# Patient Record
Sex: Female | Born: 2000 | Race: White | Hispanic: No | Marital: Single
Health system: Southern US, Community
[De-identification: ages and names within clinical notes are randomized; demographics above are authoritative.]

---
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
## 2020-03-02 ENCOUNTER — Ambulatory Visit: Payer: Self-pay | Attending: Internal Medicine

## 2020-03-02 DIAGNOSIS — Z713 Dietary counseling and surveillance: Secondary | ICD-10-CM | POA: Diagnosis not present

## 2020-03-02 DIAGNOSIS — Z23 Encounter for immunization: Secondary | ICD-10-CM

## 2020-03-02 DIAGNOSIS — Z0000 Encounter for general adult medical examination without abnormal findings: Secondary | ICD-10-CM | POA: Diagnosis not present

## 2020-03-02 DIAGNOSIS — Z7182 Exercise counseling: Secondary | ICD-10-CM | POA: Diagnosis not present

## 2020-03-02 DIAGNOSIS — Z68.41 Body mass index (BMI) pediatric, 85th percentile to less than 95th percentile for age: Secondary | ICD-10-CM | POA: Diagnosis not present

## 2020-03-02 NOTE — Progress Notes (Signed)
   Covid-19 Vaccination Clinic  Name:  Katelyn Vasquez    MRN: [id number] DOB: [birth year]  03/02/2020  [name] was observed post Covid-19 immunization for 15 minutes without incident. She was provided with Vaccine Information Sheet and instruction to access the V-Safe system.   [name] was instructed to call 911 with any severe reactions post vaccine: Marland Kitchen Difficulty breathing  . Swelling of face and throat  . A fast heartbeat  . A bad rash all over body  . Dizziness and weakness   Immunizations Administered    Name Date Dose VIS Date Route   Pfizer COVID-19 Vaccine 03/02/2020  4:27 PM 0.3 mL 09/24/2018 Intramuscular   Manufacturer: [employer], [employer]   Lot: [id number]   NDC: [address]-6

## 2020-05-09 DIAGNOSIS — Z1152 Encounter for screening for COVID-19: Secondary | ICD-10-CM | POA: Diagnosis not present

## 2020-05-09 DIAGNOSIS — J029 Acute pharyngitis, unspecified: Secondary | ICD-10-CM | POA: Diagnosis not present

## 2020-07-23 DIAGNOSIS — Z20828 Contact with and (suspected) exposure to other viral communicable diseases: Secondary | ICD-10-CM | POA: Diagnosis not present

## 2020-07-23 DIAGNOSIS — R0981 Nasal congestion: Secondary | ICD-10-CM | POA: Diagnosis not present

## 2020-07-29 DIAGNOSIS — U071 COVID-19: Secondary | ICD-10-CM | POA: Diagnosis not present

## 2020-12-25 DIAGNOSIS — Z7189 Other specified counseling: Secondary | ICD-10-CM | POA: Diagnosis not present

## 2020-12-25 DIAGNOSIS — Z68.41 Body mass index (BMI) pediatric, 85th percentile to less than 95th percentile for age: Secondary | ICD-10-CM | POA: Diagnosis not present

## 2020-12-25 DIAGNOSIS — Z0000 Encounter for general adult medical examination without abnormal findings: Secondary | ICD-10-CM | POA: Diagnosis not present

## 2020-12-25 DIAGNOSIS — Z713 Dietary counseling and surveillance: Secondary | ICD-10-CM | POA: Diagnosis not present

## 2021-03-09 ENCOUNTER — Other Ambulatory Visit (HOSPITAL_COMMUNITY): Payer: Self-pay

## 2021-03-09 MED ORDER — KETOROLAC TROMETHAMINE 10 MG PO TABS
10.0000 mg | ORAL_TABLET | Freq: Four times a day (QID) | ORAL | 0 refills | Status: DC
  Filled 2021-03-09: qty 16, 4d supply, fill #0

## 2021-03-09 MED ORDER — HYDROCODONE-ACETAMINOPHEN 7.5-325 MG PO TABS
0.5000 | ORAL_TABLET | ORAL | 0 refills | Status: DC | PRN
  Filled 2021-03-09: qty 6, 1d supply, fill #0

## 2021-03-14 DIAGNOSIS — K011 Impacted teeth: Secondary | ICD-10-CM | POA: Diagnosis not present

## 2021-07-06 DIAGNOSIS — F419 Anxiety disorder, unspecified: Secondary | ICD-10-CM | POA: Diagnosis not present

## 2021-07-13 DIAGNOSIS — F419 Anxiety disorder, unspecified: Secondary | ICD-10-CM | POA: Diagnosis not present

## 2021-07-15 ENCOUNTER — Other Ambulatory Visit (HOSPITAL_COMMUNITY): Payer: Self-pay

## 2021-07-15 ENCOUNTER — Ambulatory Visit: Payer: BC Managed Care – PPO | Admitting: Sports Medicine

## 2021-07-15 ENCOUNTER — Encounter: Payer: Self-pay | Admitting: Sports Medicine

## 2021-07-15 DIAGNOSIS — B070 Plantar wart: Secondary | ICD-10-CM | POA: Diagnosis not present

## 2021-07-15 DIAGNOSIS — M79671 Pain in right foot: Secondary | ICD-10-CM

## 2021-07-15 DIAGNOSIS — R61 Generalized hyperhidrosis: Secondary | ICD-10-CM | POA: Diagnosis not present

## 2021-07-15 MED ORDER — DRYSOL 20 % EX SOLN
Freq: Every day | CUTANEOUS | 5 refills | Status: AC
  Filled 2021-07-15 – 2021-07-28 (×2): qty 60, 30d supply, fill #0

## 2021-07-15 MED ORDER — CIMETIDINE 200 MG PO TABS
200.0000 mg | ORAL_TABLET | Freq: Every day | ORAL | 1 refills | Status: AC
  Filled 2021-07-15: qty 30, 30d supply, fill #0

## 2021-07-15 NOTE — Progress Notes (Signed)
Subjective: Katelyn Vasquez is a 20 y.o. female patient who presents to office for evaluation of Right foot pain secondary to moderately painful warts. States that for the last few months a few more has popped up, tried to cut them out herself.  Patient denies any other pedal complaints.   Review of Systems  All other systems reviewed and are negative.   Patient Active Problem List   Diagnosis Date Noted   Tension-type headache, not intractable 02/02/2016   Migraine with aura and without status migrainosus, not intractable 03/04/2014     No current outpatient medications on file prior to visit.   No current facility-administered medications on file prior to visit.    No Known Allergies  Objective:  General: Alert and oriented x3 in no acute distress  Dermatology: Keratotic lesion present measuring <0.2 cm at plantar ball, medial arch and heel on  right foot there are 3 lesions with no skin lines transversing the lesion, pain is present with medial lateral pressure to the lesion, capillaries with pin point bleeding noted, no webspace macerations, no ecchymosis bilateral, all nails x 10 are well manicured.  Vascular: Dorsalis Pedis and Posterior Tibial pedal pulses 2/4, Capillary Fill Time 3 seconds, + pedal hair growth bilateral, no edema bilateral lower extremities, Temperature gradient mildly increased, hyperhidrosis bilateral.  Neurology: Gross sensation intact via light touch bilateral.  Musculoskeletal: Mild tenderness with palpation at the lesion sites on Right, Muscular strength 5/5 in all groups without pain or limitation on range of motion. No symptomatic lower extremity muscular or boney deformity noted.  Assessment and Plan: Problem List Items Addressed This Visit   None Visit Diagnoses     Plantar wart of right foot    -  Primary   Right foot pain       Hyperhidrosis          -Complete examination performed -Discussed treatment options for warts -Parred keratoic  warty lesions x 3 using a chisel blade; treated the area with Catharidin covered with bandaid and coban; Advised patient of blistering reaction that will occur from application of medication and once this happens replace bandaid with neosporin and tape/bandaid -Rx Drysol for hyperhydrosis -Rx Tagamet to help with wart immunity  -Patient to return to office in 4 weeks for wart recheck or sooner if condition worsens.  Asencion Islam, DPM

## 2021-07-26 ENCOUNTER — Other Ambulatory Visit (HOSPITAL_COMMUNITY): Payer: Self-pay

## 2021-07-28 ENCOUNTER — Other Ambulatory Visit (HOSPITAL_COMMUNITY): Payer: Self-pay

## 2021-08-12 ENCOUNTER — Encounter: Payer: Self-pay | Admitting: Sports Medicine

## 2021-08-12 ENCOUNTER — Other Ambulatory Visit: Payer: Self-pay

## 2021-08-12 ENCOUNTER — Ambulatory Visit: Payer: BC Managed Care – PPO | Admitting: Sports Medicine

## 2021-08-12 DIAGNOSIS — B070 Plantar wart: Secondary | ICD-10-CM

## 2021-08-12 DIAGNOSIS — M79671 Pain in right foot: Secondary | ICD-10-CM

## 2021-08-12 DIAGNOSIS — R61 Generalized hyperhidrosis: Secondary | ICD-10-CM

## 2021-08-13 ENCOUNTER — Encounter: Payer: Self-pay | Admitting: Sports Medicine

## 2021-08-13 NOTE — Progress Notes (Signed)
Subjective: Katelyn Vasquez is a 21 y.o. female patient who presents to office for follow up evaluation of right foot wart, reports that it looks better. Think that its healed up.    Patient Active Problem List   Diagnosis Date Noted   Tension-type headache, not intractable 02/02/2016   Migraine with aura and without status migrainosus, not intractable 03/04/2014     Current Outpatient Medications on File Prior to Visit  Medication Sig Dispense Refill   aluminum chloride (DRYSOL) 20 % external solution Apply topically at bedtime. 60 mL 5   cimetidine (TAGAMET HB) 200 MG tablet Take 1 tablet (200 mg total) by mouth at bedtime. 30 tablet 1   No current facility-administered medications on file prior to visit.    No Known Allergies  Objective:  General: Alert and oriented x3 in no acute distress  Dermatology: Minimal Keratotic lesion present measuring <0.1 cm at plantar ball, medial arch and heel on  right foot there are 3 lesions with no skin lines transversing the lesion, pain is present with medial lateral pressure to the lesion, capillaries with pin point bleeding noted, no webspace macerations, no ecchymosis bilateral, all nails x 10 are well manicured.  Vascular: Dorsalis Pedis and Posterior Tibial pedal pulses 2/4, Capillary Fill Time 3 seconds, + pedal hair growth bilateral, no edema bilateral lower extremities, Temperature gradient mildly increased, hyperhidrosis bilateral.  Neurology: Gross sensation intact via light touch bilateral.  Musculoskeletal: Mild tenderness with palpation at the lesion sites on Right, Muscular strength 5/5 in all groups without pain or limitation on range of motion. No symptomatic lower extremity muscular or boney deformity noted.  Assessment and Plan: Problem List Items Addressed This Visit   None Visit Diagnoses     Plantar wart of right foot    -  Primary   Right foot pain       Hyperhidrosis          -Complete examination  performed -Discussed treatment options for warts -Parred keratoic warty lesions x 3 using a chisel blade; treated the area with Catharidin covered with bandaid, This is treatment #2 to area. Advised patient of blistering reaction that will occur from application of medication and once this happens replace bandaid with neosporin and tape/bandaid -Continue with Drysol for hyperhydrosis -Tagamet completed to help with wart immunity  -Patient to return to office PRN or sooner if condition worsens.  Asencion Islam, DPM

## 2021-09-05 ENCOUNTER — Other Ambulatory Visit (HOSPITAL_COMMUNITY): Payer: Self-pay

## 2021-09-05 DIAGNOSIS — L709 Acne, unspecified: Secondary | ICD-10-CM | POA: Diagnosis not present

## 2021-09-05 DIAGNOSIS — Z1339 Encounter for screening examination for other mental health and behavioral disorders: Secondary | ICD-10-CM | POA: Diagnosis not present

## 2021-09-06 ENCOUNTER — Other Ambulatory Visit (HOSPITAL_COMMUNITY): Payer: Self-pay

## 2021-09-06 MED ORDER — CLINDAMYCIN PHOSPHATE 1 % EX LOTN
1.0000 "application " | TOPICAL_LOTION | Freq: Two times a day (BID) | CUTANEOUS | 6 refills | Status: AC | PRN
  Filled 2021-09-06: qty 60, 30d supply, fill #0

## 2021-09-14 ENCOUNTER — Other Ambulatory Visit (HOSPITAL_COMMUNITY): Payer: Self-pay

## 2022-01-06 ENCOUNTER — Other Ambulatory Visit (HOSPITAL_COMMUNITY): Payer: Self-pay

## 2022-01-06 MED ORDER — AMOXICILLIN-POT CLAVULANATE 875-125 MG PO TABS
1.0000 | ORAL_TABLET | Freq: Two times a day (BID) | ORAL | 0 refills | Status: AC
  Filled 2022-01-06: qty 14, 7d supply, fill #0

## 2022-01-24 ENCOUNTER — Other Ambulatory Visit (HOSPITAL_COMMUNITY): Payer: Self-pay

## 2022-01-24 DIAGNOSIS — L700 Acne vulgaris: Secondary | ICD-10-CM | POA: Diagnosis not present

## 2022-01-24 MED ORDER — TRETINOIN 0.025 % EX CREA
1.0000 "application " | TOPICAL_CREAM | Freq: Every evening | CUTANEOUS | 3 refills | Status: AC
  Filled 2022-01-24: qty 20, 30d supply, fill #0
  Filled 2022-02-07: qty 20, 15d supply, fill #0

## 2022-01-24 MED ORDER — CLINDAMYCIN PHOS-BENZOYL PEROX 1.2-5 % EX GEL
1.0000 "application " | Freq: Every day | CUTANEOUS | 3 refills | Status: AC
  Filled 2022-01-24 – 2022-02-07 (×2): qty 45, 30d supply, fill #0

## 2022-01-25 ENCOUNTER — Other Ambulatory Visit (HOSPITAL_COMMUNITY): Payer: Self-pay

## 2022-02-03 ENCOUNTER — Other Ambulatory Visit (HOSPITAL_COMMUNITY): Payer: Self-pay

## 2022-02-06 DIAGNOSIS — Z111 Encounter for screening for respiratory tuberculosis: Secondary | ICD-10-CM | POA: Diagnosis not present

## 2022-02-07 ENCOUNTER — Other Ambulatory Visit (HOSPITAL_COMMUNITY): Payer: Self-pay

## 2022-03-09 ENCOUNTER — Other Ambulatory Visit (HOSPITAL_COMMUNITY): Payer: Self-pay

## 2022-03-09 DIAGNOSIS — Z3A30 30 weeks gestation of pregnancy: Secondary | ICD-10-CM | POA: Diagnosis not present

## 2022-03-09 DIAGNOSIS — N946 Dysmenorrhea, unspecified: Secondary | ICD-10-CM | POA: Diagnosis not present

## 2022-03-09 DIAGNOSIS — R635 Abnormal weight gain: Secondary | ICD-10-CM | POA: Diagnosis not present

## 2022-03-09 MED ORDER — MOUNJARO 2.5 MG/0.5ML ~~LOC~~ SOAJ
2.5000 mg | SUBCUTANEOUS | 11 refills | Status: DC
  Filled 2022-03-09: qty 2, 28d supply, fill #0
  Filled 2022-04-13 – 2022-05-01 (×4): qty 2, 28d supply, fill #1

## 2022-03-09 MED ORDER — LO LOESTRIN FE 1 MG-10 MCG / 10 MCG PO TABS
1.0000 | ORAL_TABLET | Freq: Every day | ORAL | 3 refills | Status: DC
  Filled 2022-03-09: qty 84, 84d supply, fill #0
  Filled 2022-06-14: qty 84, 84d supply, fill #1
  Filled 2022-11-09: qty 84, 84d supply, fill #2
  Filled 2023-03-09: qty 84, 84d supply, fill #3

## 2022-03-13 ENCOUNTER — Other Ambulatory Visit (HOSPITAL_COMMUNITY): Payer: Self-pay

## 2022-03-23 DIAGNOSIS — Z111 Encounter for screening for respiratory tuberculosis: Secondary | ICD-10-CM | POA: Diagnosis not present

## 2022-04-12 ENCOUNTER — Other Ambulatory Visit (HOSPITAL_COMMUNITY): Payer: Self-pay

## 2022-04-12 MED ORDER — MOUNJARO 5 MG/0.5ML ~~LOC~~ SOAJ
5.0000 mg | SUBCUTANEOUS | 1 refills | Status: DC
  Filled 2022-04-12 – 2022-05-01 (×5): qty 2, 28d supply, fill #0

## 2022-04-13 ENCOUNTER — Other Ambulatory Visit (HOSPITAL_COMMUNITY): Payer: Self-pay

## 2022-04-26 ENCOUNTER — Other Ambulatory Visit (HOSPITAL_COMMUNITY): Payer: Self-pay

## 2022-04-27 ENCOUNTER — Other Ambulatory Visit (HOSPITAL_COMMUNITY): Payer: Self-pay

## 2022-05-01 ENCOUNTER — Other Ambulatory Visit (HOSPITAL_COMMUNITY): Payer: Self-pay

## 2022-05-03 DIAGNOSIS — Z23 Encounter for immunization: Secondary | ICD-10-CM | POA: Diagnosis not present

## 2022-06-14 ENCOUNTER — Other Ambulatory Visit (HOSPITAL_COMMUNITY): Payer: Self-pay

## 2022-06-15 ENCOUNTER — Other Ambulatory Visit (HOSPITAL_COMMUNITY): Payer: Self-pay

## 2022-08-01 DIAGNOSIS — J101 Influenza due to other identified influenza virus with other respiratory manifestations: Secondary | ICD-10-CM | POA: Diagnosis not present

## 2022-08-01 DIAGNOSIS — J029 Acute pharyngitis, unspecified: Secondary | ICD-10-CM | POA: Diagnosis not present

## 2022-08-01 DIAGNOSIS — R0981 Nasal congestion: Secondary | ICD-10-CM | POA: Diagnosis not present

## 2022-11-09 ENCOUNTER — Other Ambulatory Visit (HOSPITAL_COMMUNITY): Payer: Self-pay

## 2022-11-29 DIAGNOSIS — Z111 Encounter for screening for respiratory tuberculosis: Secondary | ICD-10-CM | POA: Diagnosis not present

## 2022-11-29 DIAGNOSIS — Z23 Encounter for immunization: Secondary | ICD-10-CM | POA: Diagnosis not present

## 2023-03-09 ENCOUNTER — Other Ambulatory Visit (HOSPITAL_COMMUNITY): Payer: Self-pay

## 2023-03-15 ENCOUNTER — Other Ambulatory Visit (HOSPITAL_COMMUNITY): Payer: Self-pay

## 2023-04-24 DIAGNOSIS — Z23 Encounter for immunization: Secondary | ICD-10-CM | POA: Diagnosis not present

## 2023-06-19 ENCOUNTER — Other Ambulatory Visit (HOSPITAL_COMMUNITY): Payer: Self-pay

## 2023-06-19 MED ORDER — LO LOESTRIN FE 1 MG-10 MCG / 10 MCG PO TABS
1.0000 | ORAL_TABLET | Freq: Every day | ORAL | 0 refills | Status: DC
  Filled 2023-06-19: qty 84, 84d supply, fill #0

## 2023-06-20 ENCOUNTER — Other Ambulatory Visit (HOSPITAL_COMMUNITY): Payer: Self-pay

## 2023-08-08 ENCOUNTER — Other Ambulatory Visit (HOSPITAL_COMMUNITY): Payer: Self-pay

## 2023-08-08 DIAGNOSIS — Z01419 Encounter for gynecological examination (general) (routine) without abnormal findings: Secondary | ICD-10-CM | POA: Diagnosis not present

## 2023-08-08 DIAGNOSIS — Z6831 Body mass index (BMI) 31.0-31.9, adult: Secondary | ICD-10-CM | POA: Diagnosis not present

## 2023-08-08 MED ORDER — LO LOESTRIN FE 1 MG-10 MCG / 10 MCG PO TABS
1.0000 | ORAL_TABLET | Freq: Every day | ORAL | 3 refills | Status: AC
  Filled 2023-08-08 – 2023-09-13 (×2): qty 84, 84d supply, fill #0
  Filled 2023-12-11: qty 84, 84d supply, fill #1
  Filled 2024-07-07 – 2024-08-07 (×2): qty 84, 84d supply, fill #2

## 2023-08-20 ENCOUNTER — Other Ambulatory Visit (HOSPITAL_COMMUNITY): Payer: Self-pay

## 2023-09-05 ENCOUNTER — Other Ambulatory Visit (HOSPITAL_COMMUNITY): Payer: Self-pay

## 2023-09-05 MED ORDER — OSELTAMIVIR PHOSPHATE 75 MG PO CAPS
75.0000 mg | ORAL_CAPSULE | Freq: Every day | ORAL | 0 refills | Status: AC
  Filled 2023-09-05: qty 10, 10d supply, fill #0

## 2023-09-13 ENCOUNTER — Other Ambulatory Visit (HOSPITAL_COMMUNITY): Payer: Self-pay

## 2023-12-11 ENCOUNTER — Other Ambulatory Visit (HOSPITAL_COMMUNITY): Payer: Self-pay

## 2023-12-18 ENCOUNTER — Other Ambulatory Visit (HOSPITAL_COMMUNITY): Payer: Self-pay

## 2024-02-05 DIAGNOSIS — Z0000 Encounter for general adult medical examination without abnormal findings: Secondary | ICD-10-CM | POA: Diagnosis not present

## 2024-02-05 DIAGNOSIS — F418 Other specified anxiety disorders: Secondary | ICD-10-CM | POA: Diagnosis not present

## 2024-02-05 DIAGNOSIS — R5383 Other fatigue: Secondary | ICD-10-CM | POA: Diagnosis not present

## 2024-02-05 DIAGNOSIS — L709 Acne, unspecified: Secondary | ICD-10-CM | POA: Diagnosis not present

## 2024-07-07 ENCOUNTER — Other Ambulatory Visit (HOSPITAL_COMMUNITY): Payer: Self-pay

## 2024-07-17 ENCOUNTER — Other Ambulatory Visit (HOSPITAL_COMMUNITY): Payer: Self-pay

## 2024-08-07 ENCOUNTER — Other Ambulatory Visit (HOSPITAL_COMMUNITY): Payer: Self-pay
